# Patient Record
Sex: Female | Born: 1980 | Race: Black or African American | Hispanic: No | Marital: Single
Health system: Southern US, Community
[De-identification: ages and names within clinical notes are randomized; demographics above are authoritative.]

---

## 2020-12-02 ENCOUNTER — Emergency Department: Payer: PRIVATE HEALTH INSURANCE

## 2020-12-02 ENCOUNTER — Emergency Department
Admission: EM | Admit: 2020-12-02 | Discharge: 2020-12-02 | Disposition: A | Discharge status: Home / Self Care | Payer: PRIVATE HEALTH INSURANCE | Attending: Emergency Medicine | Admitting: Emergency Medicine

## 2020-12-02 ENCOUNTER — Other Ambulatory Visit: Payer: Self-pay

## 2020-12-02 DIAGNOSIS — M25561 Pain in right knee: Secondary | ICD-10-CM

## 2020-12-02 DIAGNOSIS — Y92410 Unspecified street and highway as the place of occurrence of the external cause: Secondary | ICD-10-CM | POA: Insufficient documentation

## 2020-12-02 DIAGNOSIS — M25562 Pain in left knee: Secondary | ICD-10-CM | POA: Diagnosis not present

## 2020-12-02 MED ORDER — NAPROXEN 500 MG PO TABS
500.0000 mg | ORAL_TABLET | Freq: Once | ORAL | Status: AC
  Administered 2020-12-02: 500 mg via ORAL
  Filled 2020-12-02: qty 1

## 2020-12-02 MED ORDER — ACETAMINOPHEN 500 MG PO TABS
1000.0000 mg | ORAL_TABLET | Freq: Once | ORAL | Status: AC
  Administered 2020-12-02: 1000 mg via ORAL
  Filled 2020-12-02: qty 2
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
## 2020-12-02 NOTE — Discharge Instructions (Addendum)
Use Tylenol for pain and fevers.  Up to 1000 mg per dose, up to 4 times per day.  Do not take more than 4000 mg of Tylenol/acetaminophen within 24 hours..  Use naproxen/Aleve for anti-inflammatory pain relief. Use up to 500mg every 12 hours. Do not take more frequently than this. Do not use other NSAIDs (ibuprofen, Advil) while taking this medication. It is safe to take Tylenol with this.   
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
## 2021-11-19 ENCOUNTER — Emergency Department: Payer: PRIVATE HEALTH INSURANCE

## 2021-11-19 ENCOUNTER — Emergency Department
Admission: EM | Admit: 2021-11-19 | Discharge: 2021-11-19 | Disposition: A | Discharge status: Home / Self Care | Payer: PRIVATE HEALTH INSURANCE | Attending: Emergency Medicine | Admitting: Emergency Medicine

## 2021-11-19 DIAGNOSIS — D72829 Elevated white blood cell count, unspecified: Secondary | ICD-10-CM | POA: Insufficient documentation

## 2021-11-19 DIAGNOSIS — R112 Nausea with vomiting, unspecified: Secondary | ICD-10-CM | POA: Diagnosis not present

## 2021-11-19 DIAGNOSIS — R1084 Generalized abdominal pain: Secondary | ICD-10-CM | POA: Diagnosis not present

## 2021-11-19 DIAGNOSIS — R109 Unspecified abdominal pain: Secondary | ICD-10-CM | POA: Diagnosis present

## 2021-11-19 DIAGNOSIS — K529 Noninfective gastroenteritis and colitis, unspecified: Secondary | ICD-10-CM

## 2021-11-19 LAB — URINALYSIS, COMPLETE (UACMP) WITH MICROSCOPIC
Bacteria, UA: NONE SEEN
Bilirubin Urine: NEGATIVE
Glucose, UA: NEGATIVE mg/dL
Hgb urine dipstick: NEGATIVE
Ketones, ur: NEGATIVE mg/dL
Leukocytes,Ua: NEGATIVE
Nitrite: NEGATIVE
Protein, ur: NEGATIVE mg/dL
Specific Gravity, Urine: 1.018 (ref 1.005–1.030)
pH: 7 (ref 5.0–8.0)

## 2021-11-19 LAB — CBC WITH DIFFERENTIAL/PLATELET
Abs Immature Granulocytes: 0.03 10*3/uL (ref 0.00–0.07)
Basophils Absolute: 0 10*3/uL (ref 0.0–0.1)
Basophils Relative: 0 %
Eosinophils Absolute: 0 10*3/uL (ref 0.0–0.5)
Eosinophils Relative: 0 %
HCT: 33.7 % — ABNORMAL LOW (ref 36.0–46.0)
Hemoglobin: 10.1 g/dL — ABNORMAL LOW (ref 12.0–15.0)
Immature Granulocytes: 0 %
Lymphocytes Relative: 6 %
Lymphs Abs: 0.7 10*3/uL (ref 0.7–4.0)
MCH: 21.5 pg — ABNORMAL LOW (ref 26.0–34.0)
MCHC: 30 g/dL (ref 30.0–36.0)
MCV: 71.9 fL — ABNORMAL LOW (ref 80.0–100.0)
Monocytes Absolute: 0.5 10*3/uL (ref 0.1–1.0)
Monocytes Relative: 4 %
Neutro Abs: 10.9 10*3/uL — ABNORMAL HIGH (ref 1.7–7.7)
Neutrophils Relative %: 90 %
Platelets: 235 10*3/uL (ref 150–400)
RBC: 4.69 MIL/uL (ref 3.87–5.11)
RDW: 20.6 % — ABNORMAL HIGH (ref 11.5–15.5)
WBC: 12.2 10*3/uL — ABNORMAL HIGH (ref 4.0–10.5)
nRBC: 0 % (ref 0.0–0.2)

## 2021-11-19 LAB — COMPREHENSIVE METABOLIC PANEL
ALT: 66 U/L — ABNORMAL HIGH (ref 0–44)
AST: 153 U/L — ABNORMAL HIGH (ref 15–41)
Albumin: 3.7 g/dL (ref 3.5–5.0)
Alkaline Phosphatase: 54 U/L (ref 38–126)
Anion gap: 4 — ABNORMAL LOW (ref 5–15)
BUN: 12 mg/dL (ref 6–20)
CO2: 26 mmol/L (ref 22–32)
Calcium: 8.7 mg/dL — ABNORMAL LOW (ref 8.9–10.3)
Chloride: 107 mmol/L (ref 98–111)
Creatinine, Ser: 0.78 mg/dL (ref 0.44–1.00)
GFR, Estimated: >60 mL/min (ref >60)
Glucose, Bld: 137 mg/dL — ABNORMAL HIGH (ref 70–99)
Potassium: 3.7 mmol/L (ref 3.5–5.1)
Sodium: 137 mmol/L (ref 135–145)
Total Bilirubin: 0.9 mg/dL (ref 0.3–1.2)
Total Protein: 7.2 g/dL (ref 6.5–8.1)

## 2021-11-19 LAB — LIPASE, BLOOD: Lipase: 25 U/L (ref 11–51)

## 2021-11-19 LAB — HCG, QUANTITATIVE, PREGNANCY: hCG, Beta Chain, Quant, S: 1 m[IU]/mL (ref <5)

## 2021-11-19 MED ORDER — FENTANYL CITRATE PF 50 MCG/ML IJ SOSY
50.0000 ug | PREFILLED_SYRINGE | Freq: Once | INTRAMUSCULAR | Status: AC
  Administered 2021-11-19: 50 ug via INTRAVENOUS
  Filled 2021-11-19: qty 1

## 2021-11-19 MED ORDER — KETOROLAC TROMETHAMINE 30 MG/ML IJ SOLN
15.0000 mg | Freq: Once | INTRAMUSCULAR | Status: AC
  Administered 2021-11-19: 15 mg via INTRAVENOUS
  Filled 2021-11-19: qty 1

## 2021-11-19 MED ORDER — IOHEXOL 300 MG/ML  SOLN
100.0000 mL | Freq: Once | INTRAMUSCULAR | Status: AC | PRN
  Administered 2021-11-19: 100 mL via INTRAVENOUS

## 2021-11-19 MED ORDER — ONDANSETRON 4 MG PO TBDP: 4.0000 mg | ORAL_TABLET | Freq: Three times a day (TID) | ORAL | 0 refills | Status: AC | PRN

## 2021-11-19 NOTE — ED Triage Notes (Signed)
Pt brought in per EMS for abd pain x3days. Tender to Rt side abd. Fentanyl IV given , NS fluid given en route.  Pt presents to ED AAOx4 , respi even-unlabored

## 2021-11-19 NOTE — ED Provider Notes (Signed)
[facility] Provider Note    Event Date/Time   First MD Initiated Contact with Patient 11/19/21 [phone]     (approximate)   History   Abdominal Pain   HPI  Katherine Key is a 41 y.o. female with no significant past medical history who presents for evaluation of abdominal pain.  Patient reports 3 days of periumbilical abdominal pain.  The pain is dull and constant.  She has had nausea and vomiting associated with it.  She was constipated and reports taking some Dulcolax and since then has had some bowel movements.  She denies diarrhea.  She denies fever or chills, chest pain or shortness of breath.  No urinary symptoms.  Patient denies any prior abdominal surgeries other than a C-section.  Patient received fentanyl and fluids per EMS prior to arrival     No past medical history on file.   Physical Exam   Triage Vital Signs: ED Triage Vitals  Enc Vitals Group     BP 11/19/21 0438 125/82     Pulse Rate 11/19/21 0438 82     Resp 11/19/21 0438 16     Temp 11/19/21 0438 97.8 F (36.6 C)     Temp src --      SpO2 11/19/21 0438 100 %     Weight 11/19/21 0437 170 lb (77.1 kg)     Height 11/19/21 0437 5\' 2"  (1.575 m)     Head Circumference --      Peak Flow --      Pain Score 11/19/21 0600 2     Pain Loc --      Pain Edu? --      Excl. in GC? --     Most recent vital signs: Vitals:   11/19/21 0500 11/19/21 0600  BP: 117/83 129/82  Pulse: 85 92  Resp: 17 17  Temp:    SpO2: 100% 100%     Constitutional: Alert and oriented.  Patient looks uncomfortable, moaning in pain HEENT:      Head: Normocephalic and atraumatic.         Eyes: Conjunctivae are normal. Sclera is non-icteric.       Mouth/Throat: Mucous membranes are moist.       Neck: Supple with no signs of meningismus. Cardiovascular: Regular rate and rhythm. No murmurs, gallops, or rubs. 2+ symmetrical distal pulses are present in all extremities.  Respiratory: Normal respiratory effort.  Lungs are clear to auscultation bilaterally.  Gastrointestinal: Soft, abdomen is diffusely tender to palpation with no rebound or guarding  Genitourinary: No CVA tenderness. Musculoskeletal:  No edema, cyanosis, or erythema of extremities. Neurologic: Normal speech and language. Face is symmetric. Moving all extremities. No gross focal neurologic deficits are appreciated. Skin: Skin is warm, dry and intact. No rash noted. Psychiatric: Mood and affect are normal. Speech and behavior are normal.  ED Results / Procedures / Treatments   Labs (all labs ordered are listed, but only abnormal results are displayed) Labs Reviewed  CBC WITH DIFFERENTIAL/PLATELET - Abnormal; Notable for the following components:      Result Value   WBC 12.2 (*)    Hemoglobin 10.1 (*)    HCT 33.7 (*)    MCV 71.9 (*)    MCH 21.5 (*)    RDW 20.6 (*)    Neutro Abs 10.9 (*)    All other components within normal limits  COMPREHENSIVE METABOLIC PANEL - Abnormal; Notable for the following components:   Glucose, Bld 137 (*)  Calcium 8.7 (*)    AST 153 (*)    ALT 66 (*)    Anion gap 4 (*)    All other components within normal limits  URINALYSIS, COMPLETE (UACMP) WITH MICROSCOPIC - Abnormal; Notable for the following components:   Color, Urine YELLOW (*)    APPearance CLEAR (*)    All other components within normal limits  LIPASE, BLOOD  HCG, QUANTITATIVE, PREGNANCY     EKG  ED ECG REPORT I, Nita Sickle, the attending physician, personally viewed and interpreted this ECG.  Sinus rhythm with a rate of 82, normal intervals, normal axis, no ST elevations or depressions.  Normal EKG  RADIOLOGY I, Nita Sickle, attending MD, have personally viewed and interpreted the images obtained during this visit as below:  RUQ [place]: PND   ___________________________________________________ Interpretation by Radiologist:  No results found.    PROCEDURES:  Critical Care performed:  No  Procedures    IMPRESSION / MDM / ASSESSMENT AND PLAN / ED COURSE  I reviewed the triage vital signs and the nursing notes.  41 y.o. female with no significant past medical history who presents for evaluation of abdominal pain.  Patient with 3 days of periumbilical abdominal pain, nausea and vomiting.  She looks uncomfortable due to pain with normal vital signs.  Abdomen is nondistended with diffuse tenderness, no rebound or guarding  Ddx: Gastritis versus peptic ulcer disease versus pancreatitis versus gallbladder pathology versus appendicitis versus pyelonephritis versus kidney stones versus ectopic pregnancy versus diverticulitis versus constipation versus SBO   Plan: CBC, CMP, lipase, UA, pregnancy test.  Patient is continues to complain of pain after receiving IV fentanyl per EMS therefore will redose it.   MEDICATIONS GIVEN IN ED: Medications  fentaNYL (SUBLIMAZE) injection 50 mcg (50 mcg Intravenous Given 11/19/21 0644)     ED COURSE: Initial labs showing leukocytosis with white count 12.2 and a left shift, mildly elevated LFTs with AST of 153 and ALT of 66.  Normal lipase, normal electrolytes.  UA with no signs of infection or blood.  Pregnancy test negative.  Right upper quadrant ultrasound is pending.  If that is negative we will send patient for CT abdomen/pelvis   Consults: none   EMR reviewed including last visit with her OB/GYN from March 2023 for menorrhagia    FINAL CLINICAL IMPRESSION(S) / ED DIAGNOSES   Final diagnoses:  Generalized abdominal pain     Rx / DC Orders   ED Discharge Orders     None        Note:  This document was prepared using Dragon voice recognition software and may include unintentional dictation errors.   Please note:  Patient was evaluated in Emergency Department today for the symptoms described in the history of present illness. Patient was evaluated in the context of the global COVID-19 pandemic, which necessitated  consideration that the patient might be at risk for infection with the SARS-CoV-2 virus that causes COVID-19. Institutional protocols and algorithms that pertain to the evaluation of patients at risk for COVID-19 are in a state of rapid change based on information released by regulatory bodies including the CDC and federal and state organizations. These policies and algorithms were followed during the patient's care in the ED.  Some ED evaluations and interventions may be delayed as a result of limited staffing during the pandemic.       Don Perking, [place], MD 11/19/21 [phone]

## 2021-11-19 NOTE — ED Provider Notes (Signed)
-----------------------------------------   7:02 AM on 11/19/2021 -----------------------------------------  Blood pressure 129/82, pulse 92, temperature 97.8 F (36.6 C), resp. rate 17, height 5\' 2"  (1.575 m), weight 77.1 kg, SpO2 100 %.  Assuming care from Dr. .  In short, Katherine Key is a 41 y.o. female with a chief complaint of Abdominal Pain .  Refer to the original H&P for additional details.  The current plan of care is to follow-up 45 imaging, plan for CT if unremarkable.  ----------------------------------------- 8:26 AM on 11/19/2021 ----------------------------------------- Right upper quadrant ultrasound is unremarkable, CT of abdomen/pelvis was performed and appears most consistent with a gastroenteritis.  There is possible bladder wall thickening, however urinalysis does not appear consistent with infection.  There is also periportal edema potentially representing hepatitis but patient only with very mild transaminitis and doubt significant acute hepatitis at this time.  On reassessment, patient endorses vomiting and diarrhea consistent with a gastroenteritis, but states she is feeling better following pain medication.  We will give IV Toradol for additional pain control and patient is appropriate for discharge home with PCP follow-up.  She was counseled to return to the ED for new or worsening symptoms, patient agrees with plan.    11/21/2021, MD 11/19/21 [phone]

## 2024-01-22 IMAGING — US US ABDOMEN LIMITED
1 series · 14 of 25 positions shown · non-contrast
Comparison: None Available.

CLINICAL DATA: 40-year-old female with 3 days of epigastric
abdominal pain.

EXAM:
ULTRASOUND ABDOMEN LIMITED RIGHT UPPER QUADRANT

[Series 1: us abdomen limited ruq (liver/gb) · 14 of 39 slices shown]
[im 1/39]
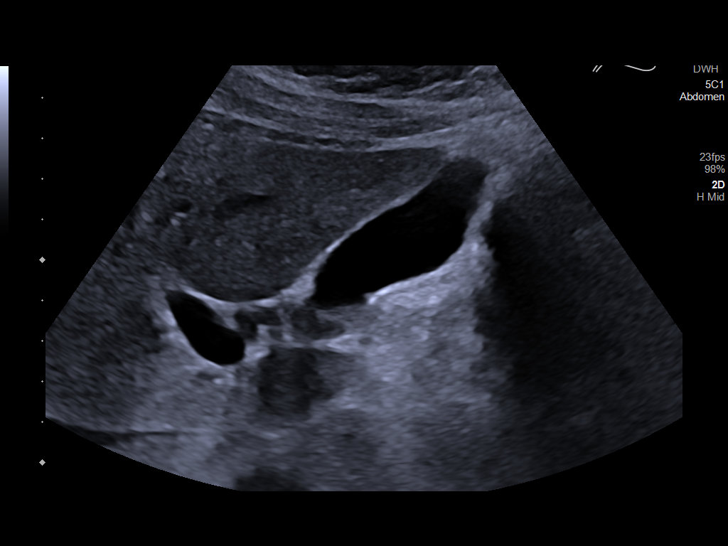
[im 4/39]
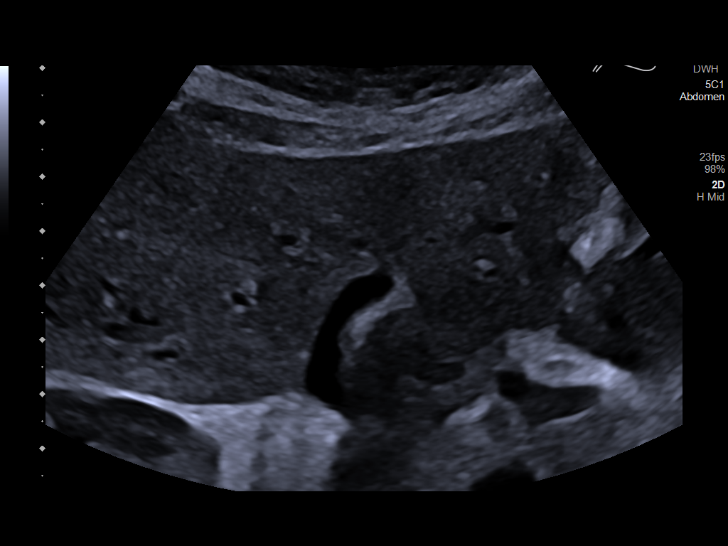
[im 7/39]
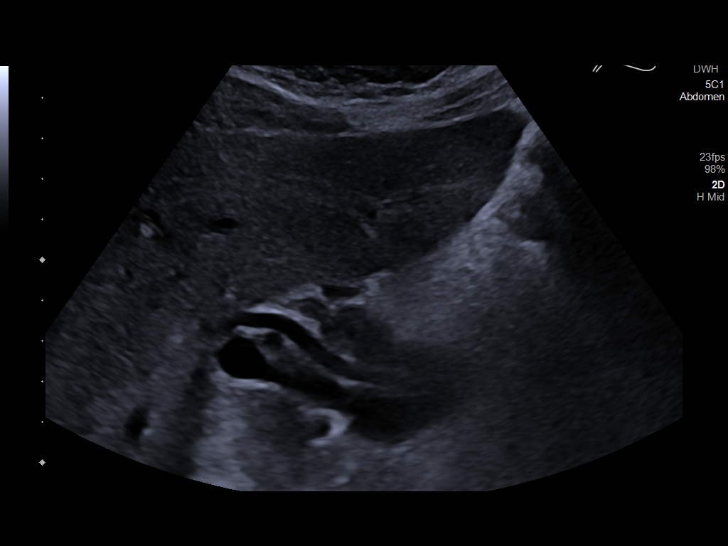
[im 10/39]
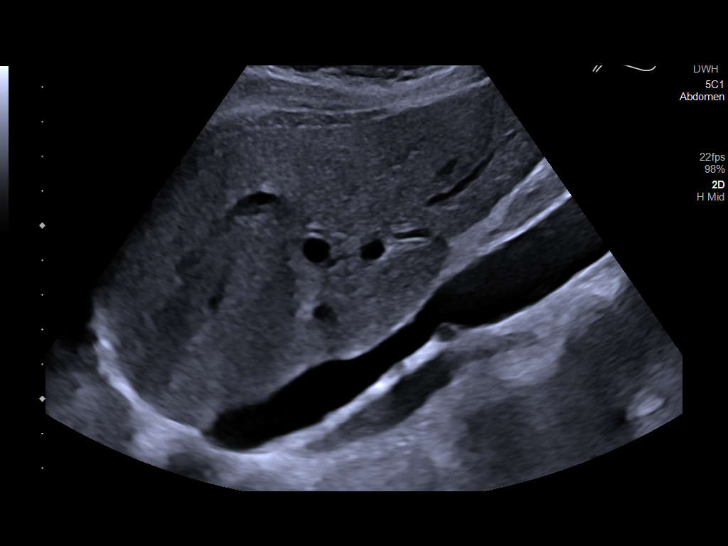
[im 13/39]
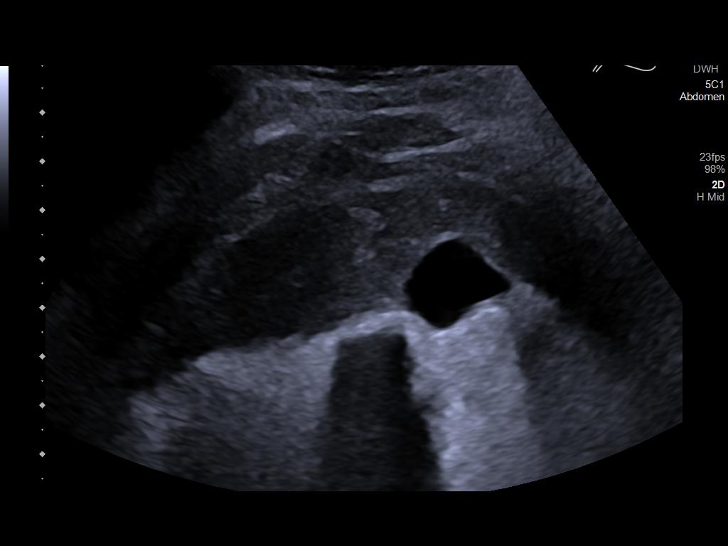
[im 15/39]
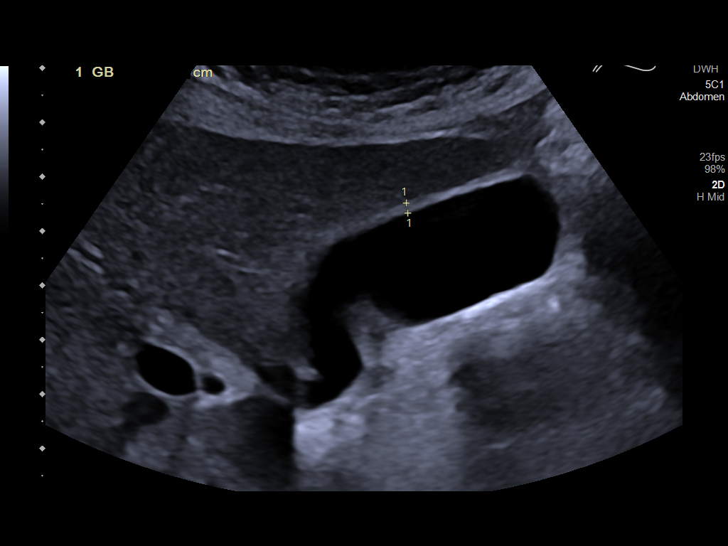
[im 18/39]
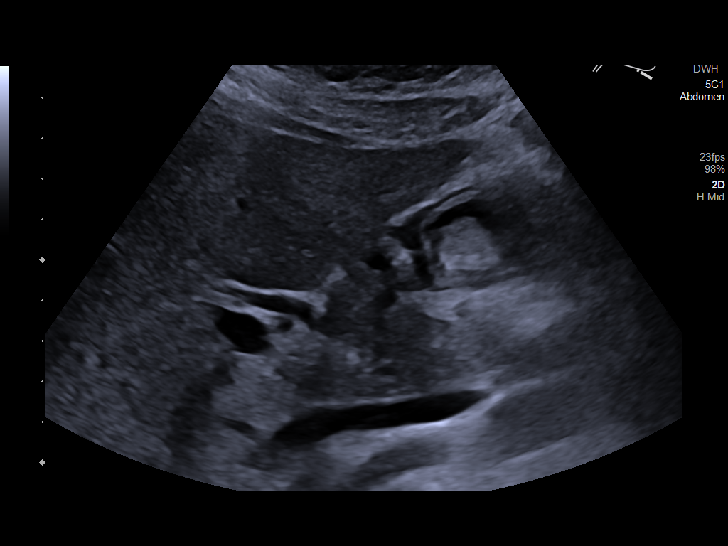
[im 21/39]
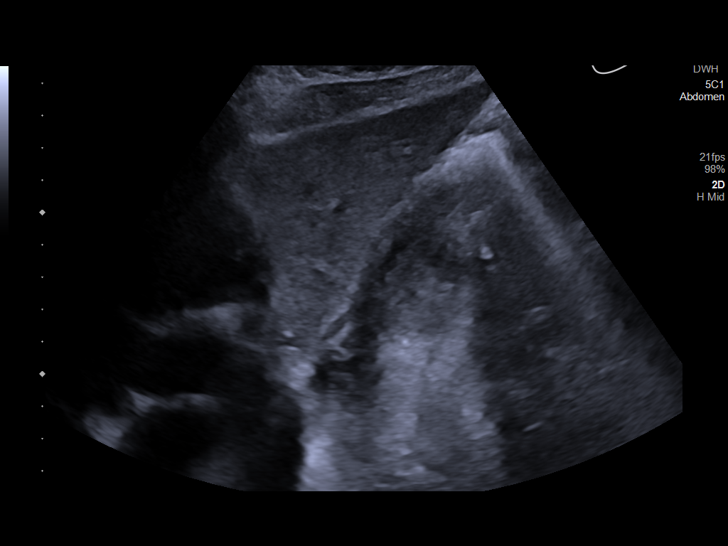
[im 24/39]
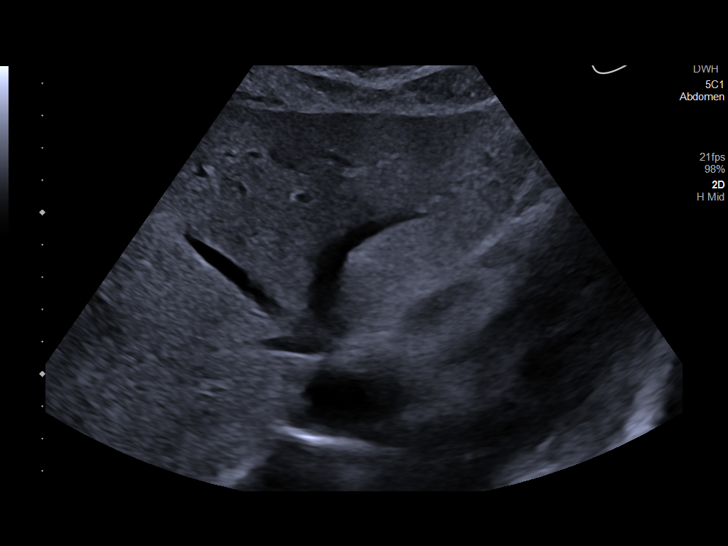
[im 26/39]
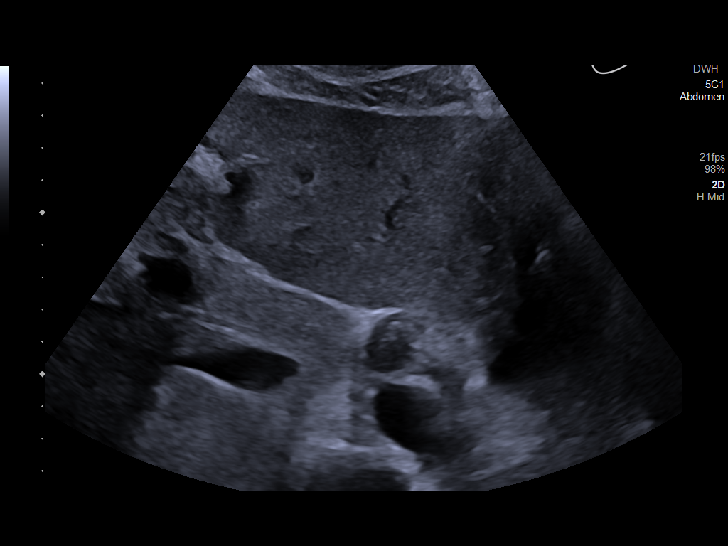
[im 29/39]
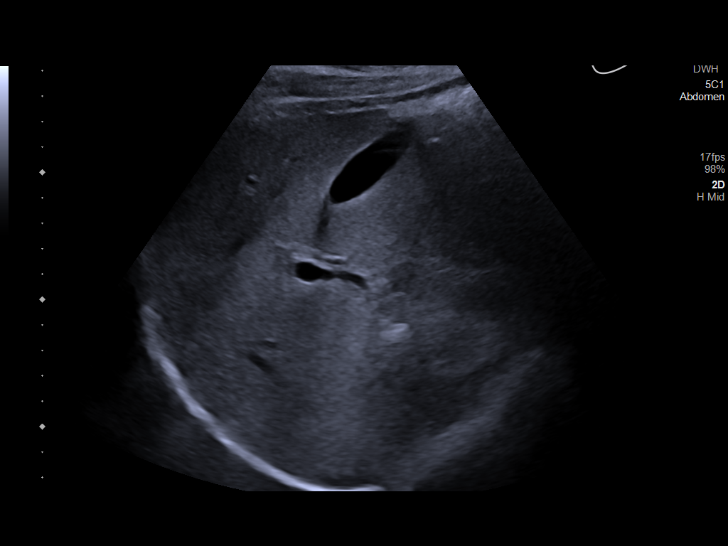
[im 32/39]
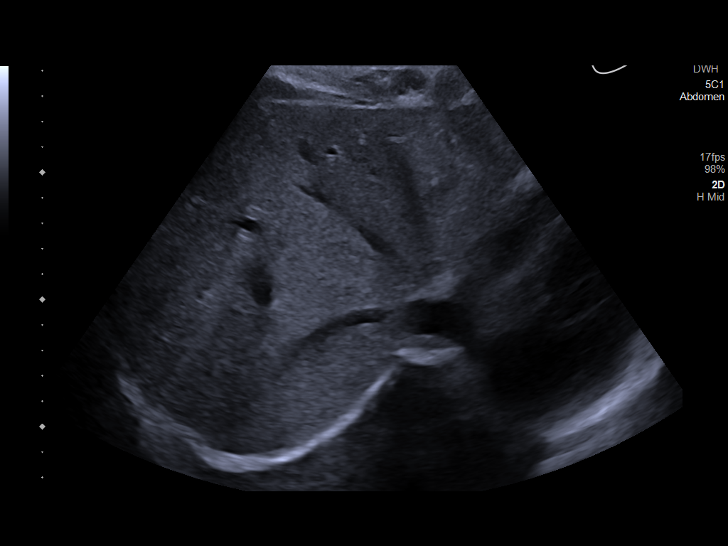
[im 35/39]
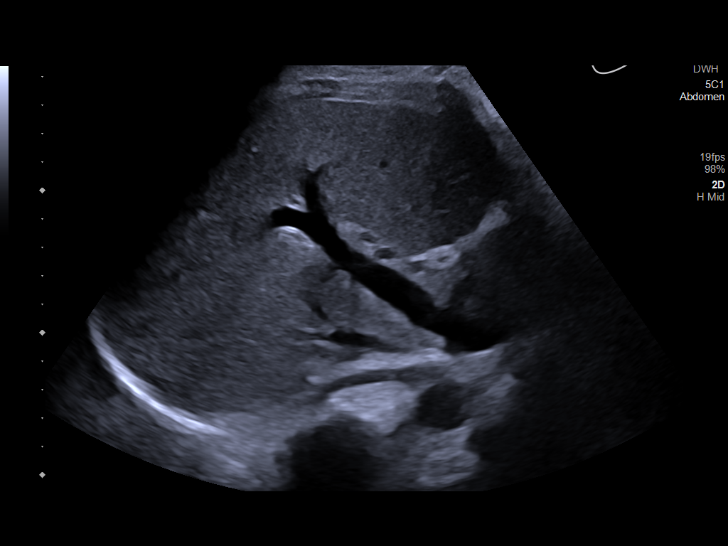
[im 39/39]
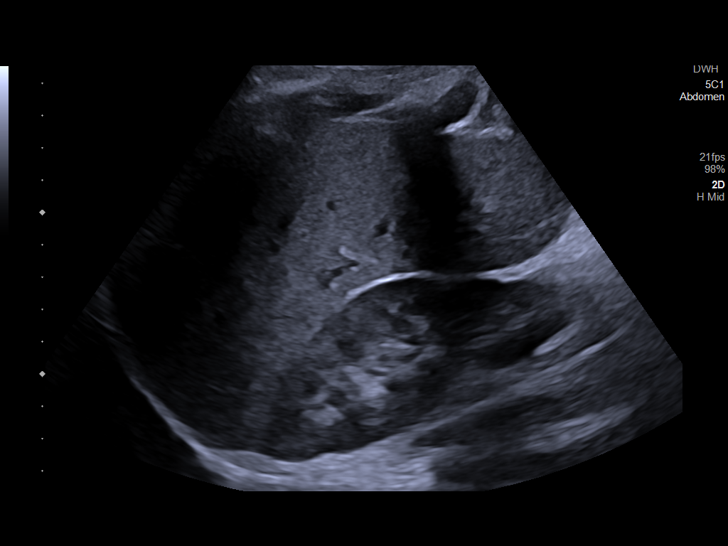

[14 of 25 positions shown; findings below may reference images not displayed]

FINDINGS: Gallbladder:

No gallstones or wall thickening visualized. No sonographic Murphy
sign noted by sonographer.

Common bile duct:

Diameter: 4 mm, normal.

Liver:

No focal lesion identified. Within normal limits in parenchymal
echogenicity. Portal vein is patent on color Doppler imaging with
normal direction of blood flow towards the liver.

Other: Negative visible right kidney.
IMPRESSION: Normal right upper quadrant ultrasound.
